# Patient Record
Sex: Male | Born: 1985 | Race: White | Hispanic: No
Health system: Southern US, Community
[De-identification: ages and names within clinical notes are randomized; demographics above are authoritative.]

## PROBLEM LIST (undated history)

## (undated) DIAGNOSIS — K219 Gastro-esophageal reflux disease without esophagitis: Secondary | ICD-10-CM

---
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
## 2012-09-28 ENCOUNTER — Encounter (HOSPITAL_COMMUNITY): Payer: Self-pay | Admitting: Family Medicine

## 2012-09-28 ENCOUNTER — Emergency Department (HOSPITAL_COMMUNITY): Payer: Self-pay

## 2012-09-28 ENCOUNTER — Emergency Department (HOSPITAL_COMMUNITY)
Admission: EM | Admit: 2012-09-28 | Discharge: 2012-09-28 | Disposition: A | Discharge status: Home / Self Care | Payer: Self-pay | Attending: Emergency Medicine | Admitting: Emergency Medicine

## 2012-09-28 DIAGNOSIS — L0390 Cellulitis, unspecified: Secondary | ICD-10-CM

## 2012-09-28 DIAGNOSIS — L02419 Cutaneous abscess of limb, unspecified: Secondary | ICD-10-CM | POA: Insufficient documentation

## 2012-09-28 DIAGNOSIS — F17200 Nicotine dependence, unspecified, uncomplicated: Secondary | ICD-10-CM | POA: Insufficient documentation

## 2012-09-28 DIAGNOSIS — L03119 Cellulitis of unspecified part of limb: Secondary | ICD-10-CM | POA: Insufficient documentation

## 2012-09-28 MED ORDER — CEPHALEXIN 250 MG PO CAPS
500.0000 mg | ORAL_CAPSULE | Freq: Once | ORAL | Status: AC
  Administered 2012-09-28: 500 mg via ORAL
  Filled 2012-09-28: qty 2

## 2012-09-28 MED ORDER — CEPHALEXIN 500 MG PO CAPS: 500.0000 mg | ORAL_CAPSULE | Freq: Four times a day (QID) | ORAL | Status: DC

## 2012-09-28 MED ORDER — OXYCODONE-ACETAMINOPHEN 5-325 MG PO TABS
2.0000 | ORAL_TABLET | Freq: Once | ORAL | Status: AC
Start: 2012-09-28 — End: 2012-09-28
  Administered 2012-09-28: 2 via ORAL
  Filled 2012-09-28: qty 2

## 2012-09-28 MED ORDER — OXYCODONE-ACETAMINOPHEN 5-325 MG PO TABS: 1.0000 | ORAL_TABLET | Freq: Four times a day (QID) | ORAL | Status: DC | PRN
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
## 2012-10-17 IMAGING — CR DG KNEE COMPLETE 4+V*L*
4 series · 4 of 4 positions shown · non-contrast
Comparison: None.

CLINICAL DATA: Pain post trauma

LEFT KNEE - COMPLETE 4+ VIEW

[t knee ap left]
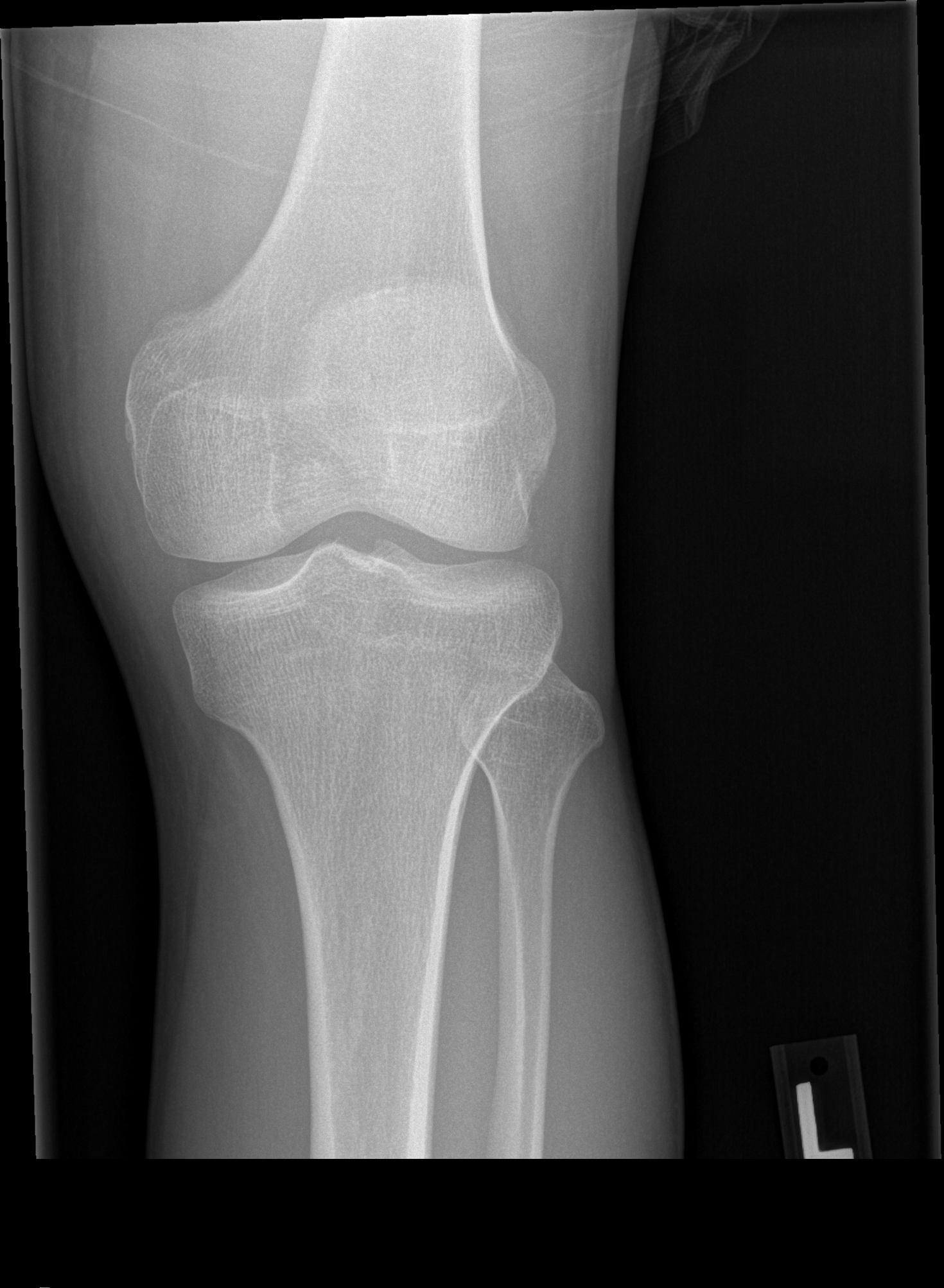

[t knee obl left (1 of 2)]
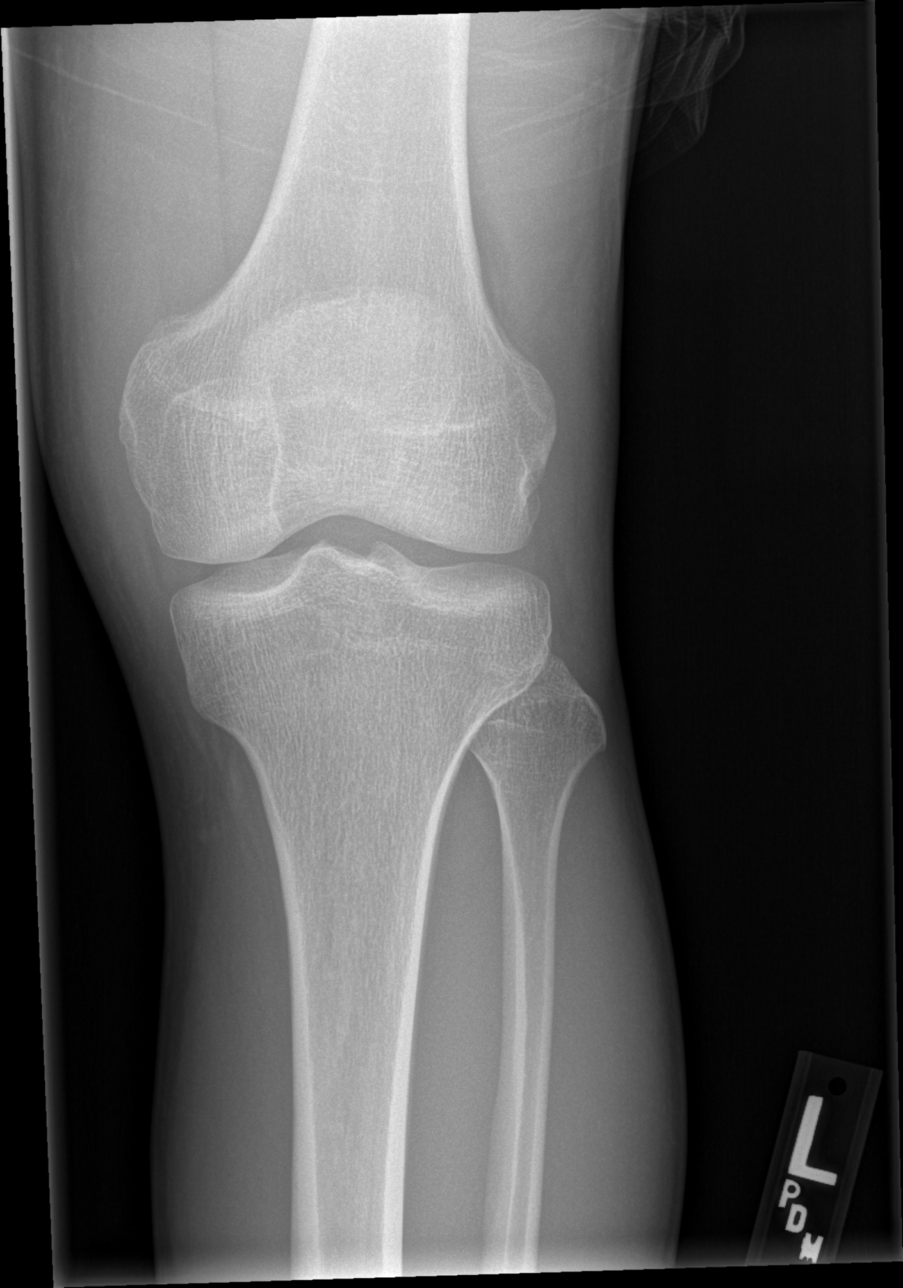

[t knee obl left (2 of 2)]
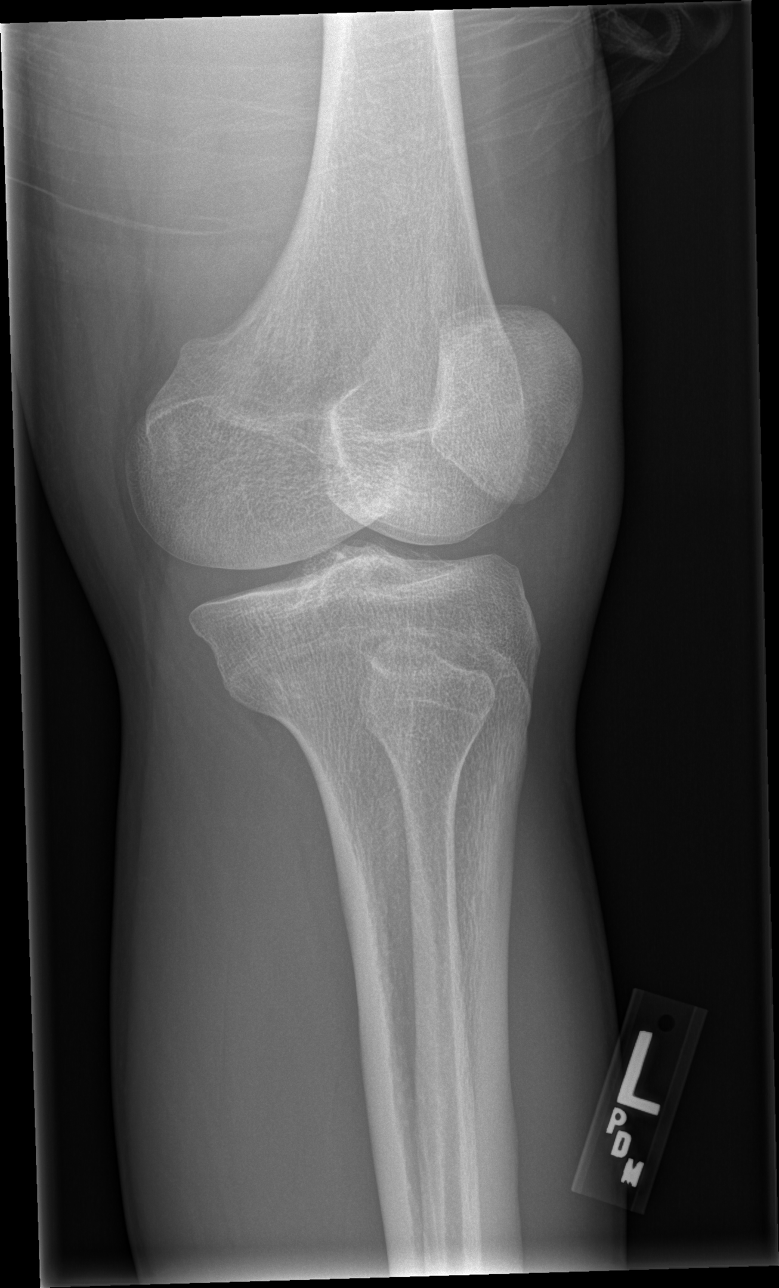

[t knee lat left]
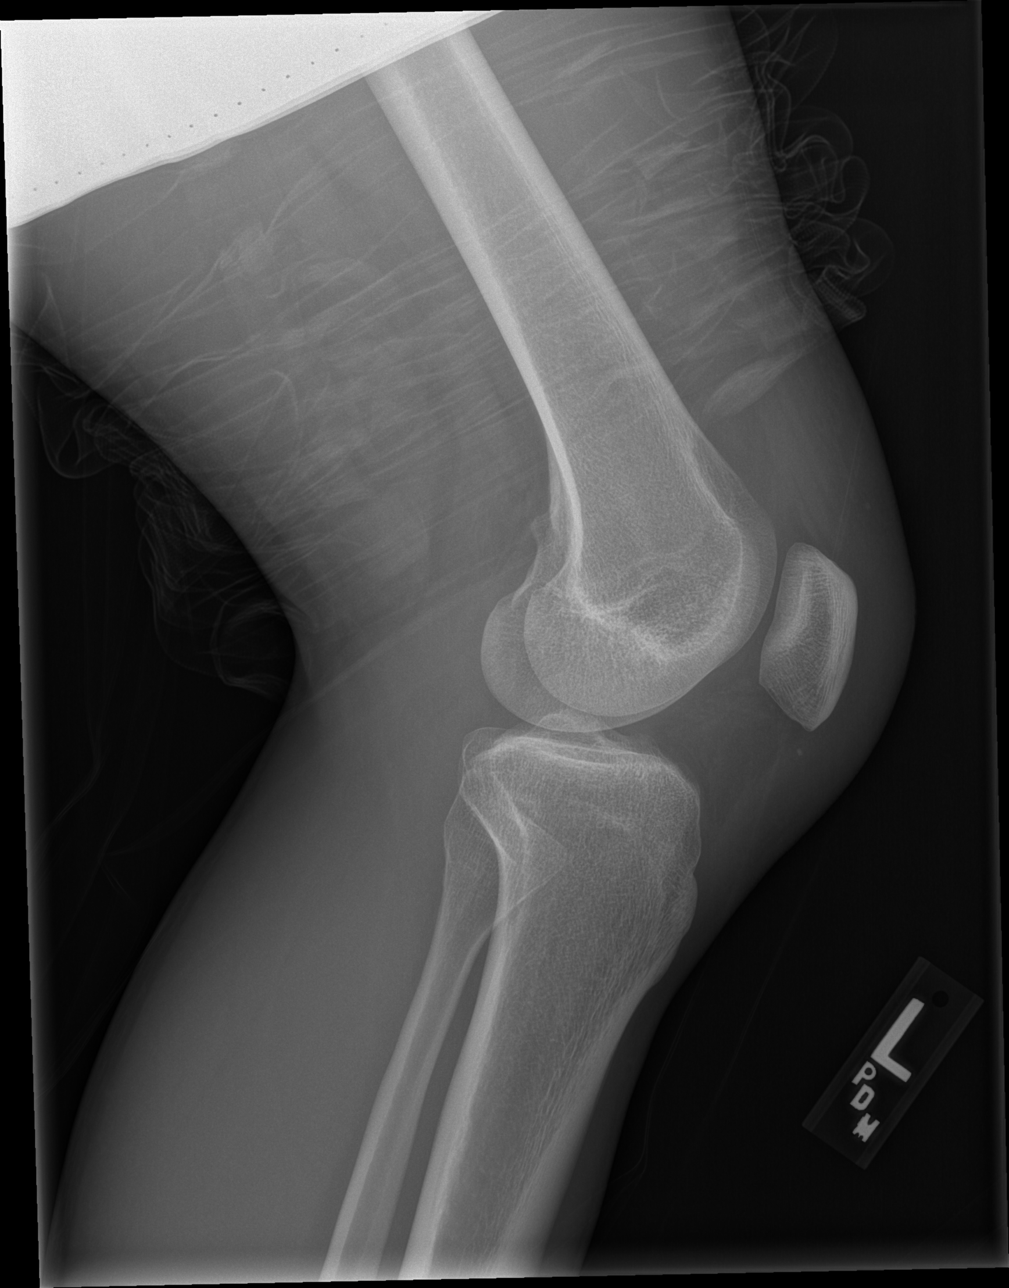

[4 of 4 positions shown; findings below may reference images not displayed]

FINDINGS: Frontal, lateral, and bilateral oblique views were
obtained.  There is no fracture, dislocation, or effusion.  Joint
spaces appear intact.
IMPRESSION: No abnormality noted.

## 2013-05-05 ENCOUNTER — Emergency Department (HOSPITAL_COMMUNITY): Payer: Self-pay

## 2013-05-05 ENCOUNTER — Emergency Department (HOSPITAL_COMMUNITY)
Admission: EM | Admit: 2013-05-05 | Discharge: 2013-05-06 | Disposition: A | Discharge status: Home / Self Care | Payer: Self-pay | Attending: Emergency Medicine | Admitting: Emergency Medicine

## 2013-05-05 ENCOUNTER — Encounter (HOSPITAL_COMMUNITY): Payer: Self-pay | Admitting: Emergency Medicine

## 2013-05-05 DIAGNOSIS — S92009A Unspecified fracture of unspecified calcaneus, initial encounter for closed fracture: Secondary | ICD-10-CM | POA: Insufficient documentation

## 2013-05-05 DIAGNOSIS — S91331A Puncture wound without foreign body, right foot, initial encounter: Secondary | ICD-10-CM

## 2013-05-05 DIAGNOSIS — Z23 Encounter for immunization: Secondary | ICD-10-CM | POA: Insufficient documentation

## 2013-05-05 DIAGNOSIS — F17200 Nicotine dependence, unspecified, uncomplicated: Secondary | ICD-10-CM | POA: Insufficient documentation

## 2013-05-05 DIAGNOSIS — S92001A Unspecified fracture of right calcaneus, initial encounter for closed fracture: Secondary | ICD-10-CM

## 2013-05-05 MED ORDER — FENTANYL CITRATE 0.05 MG/ML IJ SOLN
50.0000 ug | Freq: Once | INTRAMUSCULAR | Status: AC
  Administered 2013-05-05: 50 ug via INTRAVENOUS
  Filled 2013-05-05: qty 2

## 2013-05-05 MED ORDER — TETANUS-DIPHTH-ACELL PERTUSSIS 5-2.5-18.5 LF-MCG/0.5 IM SUSP
0.5000 mL | Freq: Once | INTRAMUSCULAR | Status: AC
  Administered 2013-05-06: 0.5 mL via INTRAMUSCULAR
  Filled 2013-05-05: qty 0.5
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
## 2013-05-05 NOTE — ED Notes (Signed)
[place] Archivist at bedside.
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
## 2013-05-05 NOTE — ED Notes (Signed)
Pt transported to XR.  

## 2013-05-06 ENCOUNTER — Encounter (HOSPITAL_COMMUNITY): Payer: Self-pay | Admitting: Radiology

## 2013-05-06 ENCOUNTER — Emergency Department (HOSPITAL_COMMUNITY): Payer: Self-pay

## 2013-05-06 MED ORDER — FENTANYL CITRATE 0.05 MG/ML IJ SOLN
100.0000 ug | Freq: Once | INTRAMUSCULAR | Status: AC
  Administered 2013-05-06: 100 ug via INTRAVENOUS
  Filled 2013-05-06: qty 2

## 2013-05-06 MED ORDER — FENTANYL CITRATE 0.05 MG/ML IJ SOLN
50.0000 ug | Freq: Once | INTRAMUSCULAR | Status: AC
  Administered 2013-05-06: 50 ug via INTRAVENOUS

## 2013-05-06 MED ORDER — OXYCODONE-ACETAMINOPHEN 5-325 MG PO TABS: 1.0000 | ORAL_TABLET | ORAL | Status: DC | PRN

## 2013-05-06 MED ORDER — CEFAZOLIN SODIUM-DEXTROSE 2-3 GM-% IV SOLR
2.0000 g | Freq: Once | INTRAVENOUS | Status: AC
  Administered 2013-05-06: 2 g via INTRAVENOUS
  Filled 2013-05-06: qty 50

## 2013-05-06 MED ORDER — CEPHALEXIN 500 MG PO CAPS: 500.0000 mg | ORAL_CAPSULE | Freq: Four times a day (QID) | ORAL | Status: DC

## 2013-05-06 MED ORDER — CEFAZOLIN SODIUM 1-5 GM-% IV SOLN: 1.0000 g | Freq: Once | INTRAVENOUS | Status: DC

## 2013-05-06 NOTE — ED Notes (Signed)
Pt has ride home.

## 2013-05-06 NOTE — ED Notes (Signed)
Pt returned from CT °

## 2013-05-06 NOTE — Progress Notes (Signed)
Orthopedic Tech Progress Note Patient Details:  Ricky Colon October 26, 1986 [id number]  Ortho Devices Type of Ortho Device: Crutches;Post (short leg) splint   Haskell Flirt 05/06/2013, 1:23 AM

## 2013-05-06 NOTE — ED Notes (Signed)
Ortho paged. 
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
## 2013-05-07 ENCOUNTER — Encounter (HOSPITAL_COMMUNITY): Payer: Self-pay | Admitting: *Deleted

## 2013-05-07 ENCOUNTER — Emergency Department (HOSPITAL_COMMUNITY)
Admission: EM | Admit: 2013-05-07 | Discharge: 2013-05-07 | Disposition: A | Discharge status: Home / Self Care | Payer: Self-pay | Attending: Emergency Medicine | Admitting: Emergency Medicine

## 2013-05-07 DIAGNOSIS — F17200 Nicotine dependence, unspecified, uncomplicated: Secondary | ICD-10-CM | POA: Insufficient documentation

## 2013-05-07 DIAGNOSIS — Z760 Encounter for issue of repeat prescription: Secondary | ICD-10-CM | POA: Insufficient documentation

## 2013-05-07 DIAGNOSIS — Z87828 Personal history of other (healed) physical injury and trauma: Secondary | ICD-10-CM | POA: Insufficient documentation

## 2013-05-07 DIAGNOSIS — R110 Nausea: Secondary | ICD-10-CM | POA: Insufficient documentation

## 2013-05-07 MED ORDER — OXYCODONE-ACETAMINOPHEN 7.5-325 MG PO TABS: 1.0000 | ORAL_TABLET | Freq: Four times a day (QID) | ORAL | Status: DC | PRN

## 2013-05-07 MED ORDER — PROMETHAZINE HCL 25 MG PO TABS: 25.0000 mg | ORAL_TABLET | Freq: Four times a day (QID) | ORAL | Status: DC | PRN
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
## 2013-05-09 ENCOUNTER — Telehealth (HOSPITAL_COMMUNITY): Payer: Self-pay | Admitting: Emergency Medicine

## 2013-05-09 ENCOUNTER — Emergency Department (HOSPITAL_COMMUNITY)
Admission: EM | Admit: 2013-05-09 | Discharge: 2013-05-09 | Disposition: A | Discharge status: Home / Self Care | Payer: Self-pay | Attending: Emergency Medicine | Admitting: Emergency Medicine

## 2013-05-09 ENCOUNTER — Encounter (HOSPITAL_COMMUNITY): Payer: Self-pay | Admitting: *Deleted

## 2013-05-09 DIAGNOSIS — M79609 Pain in unspecified limb: Secondary | ICD-10-CM | POA: Insufficient documentation

## 2013-05-09 DIAGNOSIS — F17200 Nicotine dependence, unspecified, uncomplicated: Secondary | ICD-10-CM | POA: Insufficient documentation

## 2013-05-09 DIAGNOSIS — Z760 Encounter for issue of repeat prescription: Secondary | ICD-10-CM | POA: Insufficient documentation

## 2013-05-09 MED ORDER — OXYCODONE-ACETAMINOPHEN 7.5-325 MG PO TABS: 1.0000 | ORAL_TABLET | ORAL | Status: DC | PRN
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
## 2013-05-09 NOTE — ED Provider Notes (Signed)
Medical screening examination/treatment/procedure(s) were conducted as a shared visit with non-physician practitioner(s) and myself.  I personally evaluated the patient during the encounter.  Neurovascular intact of right foot.   Patient has orthopedic followup  Donnetta Hutching, MD 05/09/13 1514

## 2013-05-10 ENCOUNTER — Encounter (HOSPITAL_COMMUNITY): Payer: Self-pay | Admitting: Pharmacy Technician

## 2013-05-10 ENCOUNTER — Encounter (HOSPITAL_COMMUNITY): Payer: Self-pay | Admitting: *Deleted

## 2013-05-10 ENCOUNTER — Other Ambulatory Visit (HOSPITAL_COMMUNITY): Payer: Self-pay | Admitting: Orthopedic Surgery

## 2013-05-11 ENCOUNTER — Encounter (HOSPITAL_COMMUNITY): Payer: Self-pay | Admitting: Anesthesiology

## 2013-05-11 ENCOUNTER — Encounter (HOSPITAL_COMMUNITY): Payer: Self-pay | Admitting: Surgery

## 2013-05-11 ENCOUNTER — Observation Stay (HOSPITAL_COMMUNITY)
Admission: RE | Admit: 2013-05-11 | Discharge: 2013-05-12 | Disposition: A | Discharge status: Home / Self Care | Payer: Self-pay | Source: Ambulatory Visit | Attending: Orthopedic Surgery | Admitting: Orthopedic Surgery

## 2013-05-11 ENCOUNTER — Encounter (HOSPITAL_COMMUNITY)
Admission: RE | Disposition: A | Discharge status: Home / Self Care | Payer: Self-pay | Source: Ambulatory Visit | Attending: Orthopedic Surgery

## 2013-05-11 ENCOUNTER — Ambulatory Visit (HOSPITAL_COMMUNITY): Payer: Self-pay | Admitting: Anesthesiology

## 2013-05-11 DIAGNOSIS — S92001S Unspecified fracture of right calcaneus, sequela: Secondary | ICD-10-CM

## 2013-05-11 DIAGNOSIS — F17200 Nicotine dependence, unspecified, uncomplicated: Secondary | ICD-10-CM | POA: Insufficient documentation

## 2013-05-11 DIAGNOSIS — S92009B Unspecified fracture of unspecified calcaneus, initial encounter for open fracture: Principal | ICD-10-CM | POA: Insufficient documentation

## 2013-05-11 DIAGNOSIS — R454 Irritability and anger: Secondary | ICD-10-CM | POA: Insufficient documentation

## 2013-05-11 HISTORY — DX: Gastro-esophageal reflux disease without esophagitis: K21.9

## 2013-05-11 HISTORY — PX: ORIF CALCANEOUS FRACTURE: SHX5030

## 2013-05-11 LAB — COMPREHENSIVE METABOLIC PANEL
ALT: 21 U/L (ref 0–53)
AST: 27 U/L (ref 0–37)
CO2: 29 mEq/L (ref 19–32)
Calcium: 9.3 mg/dL (ref 8.4–10.5)
Creatinine, Ser: 1.02 mg/dL (ref 0.50–1.35)
GFR calc Af Amer: >90 mL/min (ref >90)
GFR calc non Af Amer: >90 mL/min (ref >90)
Glucose, Bld: 84 mg/dL (ref 70–99)
Sodium: 137 mEq/L (ref 135–145)
Total Protein: 6.4 g/dL (ref 6.0–8.3)

## 2013-05-11 LAB — CBC
MCH: 32.3 pg (ref 26.0–34.0)
MCHC: 35.3 g/dL (ref 30.0–36.0)
MCV: 91.6 fL (ref 78.0–100.0)
Platelets: 211 10*3/uL (ref 150–400)

## 2013-05-11 LAB — SURGICAL PCR SCREEN: MRSA, PCR: NEGATIVE

## 2013-05-11 SURGERY — OPEN REDUCTION INTERNAL FIXATION (ORIF) CALCANEOUS FRACTURE
Anesthesia: General | Site: Foot | Laterality: Right | Wound class: Dirty or Infected

## 2013-05-11 MED ORDER — CEFAZOLIN SODIUM 1-5 GM-% IV SOLN
1.0000 g | Freq: Four times a day (QID) | INTRAVENOUS | Status: DC
  Administered 2013-05-11 – 2013-05-12 (×2): 1 g via INTRAVENOUS
  Filled 2013-05-11 (×3): qty 50

## 2013-05-11 MED ORDER — ROCURONIUM BROMIDE 100 MG/10ML IV SOLN
INTRAVENOUS | Status: DC | PRN
  Administered 2013-05-11: 40 mg via INTRAVENOUS

## 2013-05-11 MED ORDER — OXYCODONE HCL 5 MG PO TABS: 5.0000 mg | ORAL_TABLET | Freq: Once | ORAL | Status: DC | PRN

## 2013-05-11 MED ORDER — CEFAZOLIN SODIUM-DEXTROSE 2-3 GM-% IV SOLR
INTRAVENOUS | Status: AC
  Filled 2013-05-11: qty 50

## 2013-05-11 MED ORDER — MUPIROCIN 2 % EX OINT
TOPICAL_OINTMENT | CUTANEOUS | Status: AC
  Filled 2013-05-11: qty 22

## 2013-05-11 MED ORDER — GENTAMICIN SULFATE 40 MG/ML IJ SOLN
INTRAMUSCULAR | Status: AC
  Filled 2013-05-11: qty 4

## 2013-05-11 MED ORDER — ONDANSETRON HCL 4 MG/2ML IJ SOLN
4.0000 mg | Freq: Four times a day (QID) | INTRAMUSCULAR | Status: DC | PRN
  Administered 2013-05-11: 4 mg via INTRAVENOUS
  Filled 2013-05-11: qty 2

## 2013-05-11 MED ORDER — METOCLOPRAMIDE HCL 5 MG/ML IJ SOLN
5.0000 mg | Freq: Three times a day (TID) | INTRAMUSCULAR | Status: DC | PRN
  Filled 2013-05-11: qty 2

## 2013-05-11 MED ORDER — HYDROCODONE-ACETAMINOPHEN 5-325 MG PO TABS
1.0000 | ORAL_TABLET | ORAL | Status: DC | PRN
  Administered 2013-05-12: 2 via ORAL
  Filled 2013-05-11: qty 2

## 2013-05-11 MED ORDER — HYDROMORPHONE HCL PF 1 MG/ML IJ SOLN
INTRAMUSCULAR | Status: AC
  Filled 2013-05-11: qty 1

## 2013-05-11 MED ORDER — GENTAMICIN SULFATE 40 MG/ML IJ SOLN
INTRAMUSCULAR | Status: DC | PRN
  Administered 2013-05-11: 160 mg

## 2013-05-11 MED ORDER — GLYCOPYRROLATE 0.2 MG/ML IJ SOLN
INTRAMUSCULAR | Status: DC | PRN
  Administered 2013-05-11: 0.6 mg via INTRAVENOUS

## 2013-05-11 MED ORDER — MIDAZOLAM HCL 5 MG/5ML IJ SOLN
INTRAMUSCULAR | Status: DC | PRN
  Administered 2013-05-11: 2 mg via INTRAVENOUS

## 2013-05-11 MED ORDER — FENTANYL CITRATE 0.05 MG/ML IJ SOLN
INTRAMUSCULAR | Status: DC | PRN
  Administered 2013-05-11 (×2): 50 ug via INTRAVENOUS
  Administered 2013-05-11: 150 ug via INTRAVENOUS

## 2013-05-11 MED ORDER — 0.9 % SODIUM CHLORIDE (POUR BTL) OPTIME
TOPICAL | Status: DC | PRN
  Administered 2013-05-11: 1000 mL

## 2013-05-11 MED ORDER — LACTATED RINGERS IV SOLN
INTRAVENOUS | Status: DC | PRN
  Administered 2013-05-11 (×2): via INTRAVENOUS

## 2013-05-11 MED ORDER — HYDROMORPHONE HCL PF 1 MG/ML IJ SOLN
0.2500 mg | INTRAMUSCULAR | Status: DC | PRN
  Administered 2013-05-11 (×4): 0.5 mg via INTRAVENOUS

## 2013-05-11 MED ORDER — ONDANSETRON HCL 4 MG/2ML IJ SOLN: 4.0000 mg | Freq: Four times a day (QID) | INTRAMUSCULAR | Status: DC | PRN

## 2013-05-11 MED ORDER — VANCOMYCIN HCL 500 MG IV SOLR
INTRAVENOUS | Status: DC | PRN
  Administered 2013-05-11: 500 mg

## 2013-05-11 MED ORDER — LACTATED RINGERS IV SOLN
INTRAVENOUS | Status: DC
  Administered 2013-05-11: 18:00:00 via INTRAVENOUS

## 2013-05-11 MED ORDER — SODIUM CHLORIDE 0.9 % IV SOLN: INTRAVENOUS | Status: DC

## 2013-05-11 MED ORDER — HYDROMORPHONE HCL PF 1 MG/ML IJ SOLN: 0.2500 mg | INTRAMUSCULAR | Status: DC | PRN

## 2013-05-11 MED ORDER — HYDROMORPHONE HCL PF 1 MG/ML IJ SOLN
0.5000 mg | INTRAMUSCULAR | Status: DC | PRN
  Administered 2013-05-11 – 2013-05-12 (×2): 1 mg via INTRAVENOUS
  Filled 2013-05-11 (×2): qty 1

## 2013-05-11 MED ORDER — OXYCODONE HCL 5 MG/5ML PO SOLN: 5.0000 mg | Freq: Once | ORAL | Status: DC | PRN

## 2013-05-11 MED ORDER — ONDANSETRON HCL 4 MG PO TABS: 4.0000 mg | ORAL_TABLET | Freq: Four times a day (QID) | ORAL | Status: DC | PRN

## 2013-05-11 MED ORDER — LIDOCAINE HCL (CARDIAC) 20 MG/ML IV SOLN
INTRAVENOUS | Status: DC | PRN
  Administered 2013-05-11: 80 mg via INTRAVENOUS

## 2013-05-11 MED ORDER — ASPIRIN EC 325 MG PO TBEC
325.0000 mg | DELAYED_RELEASE_TABLET | Freq: Every day | ORAL | Status: DC
  Filled 2013-05-11: qty 1

## 2013-05-11 MED ORDER — HYDROMORPHONE HCL PF 1 MG/ML IJ SOLN
0.2500 mg | INTRAMUSCULAR | Status: DC | PRN
  Administered 2013-05-11 (×3): 0.5 mg via INTRAVENOUS

## 2013-05-11 MED ORDER — OXYCODONE-ACETAMINOPHEN 5-325 MG PO TABS
1.0000 | ORAL_TABLET | ORAL | Status: DC | PRN
  Administered 2013-05-11 – 2013-05-12 (×3): 2 via ORAL
  Filled 2013-05-11 (×4): qty 2

## 2013-05-11 MED ORDER — VANCOMYCIN HCL 500 MG IV SOLR
INTRAVENOUS | Status: AC
  Filled 2013-05-11: qty 500

## 2013-05-11 MED ORDER — METOCLOPRAMIDE HCL 5 MG PO TABS
5.0000 mg | ORAL_TABLET | Freq: Three times a day (TID) | ORAL | Status: DC | PRN
  Filled 2013-05-11: qty 2

## 2013-05-11 MED ORDER — NEOSTIGMINE METHYLSULFATE 1 MG/ML IJ SOLN
INTRAMUSCULAR | Status: DC | PRN
  Administered 2013-05-11: 4 mg via INTRAVENOUS

## 2013-05-11 MED ORDER — ONDANSETRON HCL 4 MG/2ML IJ SOLN
INTRAMUSCULAR | Status: DC | PRN
  Administered 2013-05-11: 4 mg via INTRAVENOUS

## 2013-05-11 MED ORDER — MUPIROCIN 2 % EX OINT
TOPICAL_OINTMENT | Freq: Two times a day (BID) | CUTANEOUS | Status: DC
  Administered 2013-05-11: 15:00:00 via NASAL

## 2013-05-11 MED ORDER — PROPOFOL 10 MG/ML IV BOLUS
INTRAVENOUS | Status: DC | PRN
  Administered 2013-05-11: 200 mg via INTRAVENOUS

## 2013-05-11 SURGICAL SUPPLY — 50 items
BANDAGE ELASTIC 4 VELCRO ST LF (GAUZE/BANDAGES/DRESSINGS) IMPLANT
BANDAGE ELASTIC 6 VELCRO ST LF (GAUZE/BANDAGES/DRESSINGS) IMPLANT
BANDAGE ESMARK 6X9 LF (GAUZE/BANDAGES/DRESSINGS) IMPLANT
BANDAGE GAUZE ELAST BULKY 4 IN (GAUZE/BANDAGES/DRESSINGS) ×1 IMPLANT
BNDG CMPR 9X6 STRL LF SNTH (GAUZE/BANDAGES/DRESSINGS)
BNDG COHESIVE 6X5 TAN STRL LF (GAUZE/BANDAGES/DRESSINGS) ×3 IMPLANT
BNDG ESMARK 6X9 LF (GAUZE/BANDAGES/DRESSINGS)
BNDG GAUZE STRTCH 6 (GAUZE/BANDAGES/DRESSINGS) ×6 IMPLANT
CLOTH BEACON ORANGE TIMEOUT ST (SAFETY) ×2 IMPLANT
COTTON STERILE ROLL (GAUZE/BANDAGES/DRESSINGS) ×2 IMPLANT
COVER SURGICAL LIGHT HANDLE (MISCELLANEOUS) ×2 IMPLANT
CUFF TOURNIQUET SINGLE 34IN LL (TOURNIQUET CUFF) IMPLANT
CUFF TOURNIQUET SINGLE 44IN (TOURNIQUET CUFF) IMPLANT
DRAPE INCISE IOBAN 66X45 STRL (DRAPES) ×2 IMPLANT
DRAPE OEC MINIVIEW 54X84 (DRAPES) ×2 IMPLANT
DRAPE PROXIMA HALF (DRAPES) ×2 IMPLANT
DRAPE U-SHAPE 47X51 STRL (DRAPES) ×2 IMPLANT
DRSG ADAPTIC 3X8 NADH LF (GAUZE/BANDAGES/DRESSINGS) ×2 IMPLANT
DRSG EMULSION OIL 3X3 NADH (GAUZE/BANDAGES/DRESSINGS) ×1 IMPLANT
DRSG PAD ABDOMINAL 8X10 ST (GAUZE/BANDAGES/DRESSINGS) ×1 IMPLANT
DURAPREP 26ML APPLICATOR (WOUND CARE) ×2 IMPLANT
ELECT REM PT RETURN 9FT ADLT (ELECTROSURGICAL) ×2
ELECTRODE REM PT RTRN 9FT ADLT (ELECTROSURGICAL) ×1 IMPLANT
GLOVE BIOGEL PI IND STRL 9 (GLOVE) ×1 IMPLANT
GLOVE BIOGEL PI INDICATOR 9 (GLOVE) ×1
GLOVE SURG ORTHO 9.0 STRL STRW (GLOVE) ×2 IMPLANT
GOWN PREVENTION PLUS XLARGE (GOWN DISPOSABLE) ×2 IMPLANT
GOWN SRG XL XLNG 56XLVL 4 (GOWN DISPOSABLE) ×2 IMPLANT
GOWN STRL NON-REIN XL XLG LVL4 (GOWN DISPOSABLE) ×4
GUIDEPIN 2.8X300 THRD TIP OIC (Screw) ×2 IMPLANT
KIT BASIN OR (CUSTOM PROCEDURE TRAY) ×2 IMPLANT
KIT ROOM TURNOVER OR (KITS) ×2 IMPLANT
KIT STIMULAN RAPID CURE 5CC (Orthopedic Implant) ×1 IMPLANT
MANIFOLD NEPTUNE II (INSTRUMENTS) ×2 IMPLANT
NS IRRIG 1000ML POUR BTL (IV SOLUTION) ×2 IMPLANT
PACK ORTHO EXTREMITY (CUSTOM PROCEDURE TRAY) ×2 IMPLANT
PAD ARMBOARD 7.5X6 YLW CONV (MISCELLANEOUS) ×4 IMPLANT
PADDING CAST COTTON 6X4 STRL (CAST SUPPLIES) ×2 IMPLANT
SCREW CANN 7.3X45 16MM THRD (Screw) ×1 IMPLANT
SCREW CANN 7.3X50 16MM THRD (Screw) ×1 IMPLANT
SPONGE GAUZE 4X4 12PLY (GAUZE/BANDAGES/DRESSINGS) ×2 IMPLANT
SPONGE LAP 18X18 X RAY DECT (DISPOSABLE) ×2 IMPLANT
STAPLER VISISTAT 35W (STAPLE) IMPLANT
SUCTION FRAZIER TIP 10 FR DISP (SUCTIONS) ×2 IMPLANT
SUT ETHILON 2 0 PSLX (SUTURE) IMPLANT
SUT VIC AB 2-0 CTB1 (SUTURE) ×4 IMPLANT
TOWEL OR 17X24 6PK STRL BLUE (TOWEL DISPOSABLE) ×2 IMPLANT
TOWEL OR 17X26 10 PK STRL BLUE (TOWEL DISPOSABLE) ×2 IMPLANT
TUBE CONNECTING 12X1/4 (SUCTIONS) ×2 IMPLANT
WATER STERILE IRR 1000ML POUR (IV SOLUTION) ×2 IMPLANT

## 2013-05-11 NOTE — ED Provider Notes (Signed)
Medical screening examination/treatment/procedure(s) were performed by non-physician practitioner and as supervising physician I was immediately available for consultation/collaboration.  Geoffery Lyons, MD 05/11/13 [phone]

## 2013-05-11 NOTE — Anesthesia Postprocedure Evaluation (Signed)
Anesthesia Post Note  Patient: Ricky Colon  Procedure(s) Performed: Procedure(s) (LRB): OPEN REDUCTION INTERNAL FIXATION (ORIF) CALCANEOUS FRACTURE (Right)  Anesthesia type: General  Patient location: PACU  Post pain: Pain level controlled and Adequate analgesia  Post assessment: Post-op Vital signs reviewed, Patient's Cardiovascular Status Stable, Respiratory Function Stable, Patent Airway and Pain level controlled  Last Vitals:  Filed Vitals:   05/11/13 2105  BP: 121/59  Pulse: 60  Temp: 36.7 C  Resp: 15    Post vital signs: Reviewed and stable  Level of consciousness: awake, alert  and oriented  Complications: No apparent anesthesia complications
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
## 2013-05-11 NOTE — Anesthesia Preprocedure Evaluation (Addendum)
Anesthesia Evaluation  Patient identified by MRN, date of birth, ID band Patient awake    Reviewed: Allergy & Precautions, H&P , NPO status , Patient's Chart, lab work & pertinent test results  History of Anesthesia Complications Negative for: history of anesthetic complications  Airway Mallampati: I TM Distance: >3 FB Neck ROM: Full    Dental  (+) Teeth Intact and Dental Advisory Given   Pulmonary Current Smoker,  breath sounds clear to auscultation  Pulmonary exam normal       Cardiovascular Exercise Tolerance: Good negative cardio ROS  Rhythm:Regular Rate:Normal     Neuro/Psych negative neurological ROS     GI/Hepatic negative GI ROS, Neg liver ROS,   Endo/Other  negative endocrine ROS  Renal/GU negative Renal ROS     Musculoskeletal negative musculoskeletal ROS (+)   Abdominal   Peds  Hematology negative hematology ROS (+)   Anesthesia Other Findings   Reproductive/Obstetrics                           Anesthesia Physical Anesthesia Plan  ASA: I  Anesthesia Plan: General   Post-op Pain Management:    Induction: Intravenous  Airway Management Planned: Oral ETT  Additional Equipment:   Intra-op Plan:   Post-operative Plan: Extubation in OR  Informed Consent: I have reviewed the patients History and Physical, chart, labs and discussed the procedure including the risks, benefits and alternatives for the proposed anesthesia with the patient or authorized representative who has indicated his/her understanding and acceptance.   Dental advisory given  Plan Discussed with: CRNA and Anesthesiologist  Anesthesia Plan Comments:         Anesthesia Quick Evaluation
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
## 2013-05-11 NOTE — Transfer of Care (Signed)
Immediate Anesthesia Transfer of Care Note  Patient: Ricky Colon  Procedure(s) Performed: Procedure(s) with comments: OPEN REDUCTION INTERNAL FIXATION (ORIF) CALCANEOUS FRACTURE (Right) - Open Reduction Internal Fixation Right Calcaneous Fracture  Patient Location: PACU  Anesthesia Type:General  Level of Consciousness: awake, alert , oriented and patient cooperative  Airway & Oxygen Therapy: Patient Spontanous Breathing and Patient connected to nasal cannula oxygen  Post-op Assessment: Report given to PACU RN, Post -op Vital signs reviewed and stable and Patient moving all extremities  Post vital signs: Reviewed and stable  Complications: No apparent anesthesia complications
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
## 2013-05-11 NOTE — Preoperative (Signed)
Beta Blockers   Reason not to administer Beta Blockers:Not Applicable 

## 2013-05-11 NOTE — Anesthesia Procedure Notes (Signed)
Procedure Name: Intubation Date/Time: 05/11/2013 6:48 PM Performed by: Angelica Pou Pre-anesthesia Checklist: Patient identified, Emergency Drugs available, Suction available, Patient being monitored and Timeout performed Patient Re-evaluated:Patient Re-evaluated prior to inductionOxygen Delivery Method: Circle system utilized Preoxygenation: Pre-oxygenation with 100% oxygen Intubation Type: IV induction Ventilation: Mask ventilation without difficulty Laryngoscope Size: Mac and 3 Grade View: Grade I Tube type: Oral Tube size: 7.5 mm Number of attempts: 1 Airway Equipment and Method: Stylet Placement Confirmation: ETT inserted through vocal cords under direct vision,  breath sounds checked- equal and bilateral and positive ETCO2 Secured at: 23 cm Tube secured with: Tape Dental Injury: Teeth and Oropharynx as per pre-operative assessment

## 2013-05-12 ENCOUNTER — Encounter (HOSPITAL_COMMUNITY): Payer: Self-pay | Admitting: Orthopedic Surgery

## 2013-05-12 MED ORDER — METHOCARBAMOL 500 MG PO TABS
500.0000 mg | ORAL_TABLET | Freq: Once | ORAL | Status: AC
  Administered 2013-05-12: 500 mg via ORAL
  Filled 2013-05-12: qty 1

## 2013-05-12 NOTE — Discharge Summary (Signed)
Discharge to home °
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
## 2013-05-12 NOTE — Progress Notes (Signed)
Patient discharged to home. Patient AVS reviewed with patient and patient's father. Patient verbalized understanding of medications and follow-up appointments.  Patient remains stable; no signs or symptoms of distress.  Patient educated to return to the ER in cases of signs of infection, SOB, dizziness, fever, chest pain, or fainting.

## 2013-05-12 NOTE — Progress Notes (Signed)
Called central monitoring to d/c tele. Pt has order for discharge, tele was on standby due to patient refusing to wear tele this am.

## 2015-09-20 ENCOUNTER — Ambulatory Visit: Payer: Self-pay | Admitting: Family Medicine

## 2020-06-02 ENCOUNTER — Inpatient Hospital Stay (HOSPITAL_COMMUNITY)
Admission: EM | Admit: 2020-06-02 | Discharge: 2020-06-05 | DRG: 040 | Disposition: A | Discharge status: Home / Self Care | Payer: Self-pay | Attending: Surgery | Admitting: Surgery

## 2020-06-02 ENCOUNTER — Emergency Department (HOSPITAL_COMMUNITY): Payer: Self-pay

## 2020-06-02 ENCOUNTER — Encounter (HOSPITAL_COMMUNITY): Payer: Self-pay | Admitting: Emergency Medicine

## 2020-06-02 DIAGNOSIS — S0240CA Maxillary fracture, right side, initial encounter for closed fracture: Secondary | ICD-10-CM | POA: Diagnosis present

## 2020-06-02 DIAGNOSIS — S01511A Laceration without foreign body of lip, initial encounter: Secondary | ICD-10-CM | POA: Diagnosis present

## 2020-06-02 DIAGNOSIS — Z20822 Contact with and (suspected) exposure to covid-19: Secondary | ICD-10-CM | POA: Diagnosis present

## 2020-06-02 DIAGNOSIS — T07XXXA Unspecified multiple injuries, initial encounter: Secondary | ICD-10-CM

## 2020-06-02 DIAGNOSIS — R402242 Coma scale, best verbal response, confused conversation, at arrival to emergency department: Secondary | ICD-10-CM | POA: Diagnosis present

## 2020-06-02 DIAGNOSIS — S0232XA Fracture of orbital floor, left side, initial encounter for closed fracture: Secondary | ICD-10-CM | POA: Diagnosis present

## 2020-06-02 DIAGNOSIS — F10229 Alcohol dependence with intoxication, unspecified: Secondary | ICD-10-CM | POA: Diagnosis present

## 2020-06-02 DIAGNOSIS — Y908 Blood alcohol level of 240 mg/100 ml or more: Secondary | ICD-10-CM | POA: Diagnosis present

## 2020-06-02 DIAGNOSIS — N179 Acute kidney failure, unspecified: Secondary | ICD-10-CM | POA: Diagnosis present

## 2020-06-02 DIAGNOSIS — R402142 Coma scale, eyes open, spontaneous, at arrival to emergency department: Secondary | ICD-10-CM | POA: Diagnosis present

## 2020-06-02 DIAGNOSIS — S0292XA Unspecified fracture of facial bones, initial encounter for closed fracture: Secondary | ICD-10-CM | POA: Diagnosis present

## 2020-06-02 DIAGNOSIS — S022XXB Fracture of nasal bones, initial encounter for open fracture: Secondary | ICD-10-CM | POA: Diagnosis present

## 2020-06-02 DIAGNOSIS — R739 Hyperglycemia, unspecified: Secondary | ICD-10-CM | POA: Diagnosis present

## 2020-06-02 DIAGNOSIS — S060X0A Concussion without loss of consciousness, initial encounter: Secondary | ICD-10-CM

## 2020-06-02 DIAGNOSIS — R000 Tachycardia, unspecified: Secondary | ICD-10-CM | POA: Diagnosis present

## 2020-06-02 DIAGNOSIS — S41112A Laceration without foreign body of left upper arm, initial encounter: Secondary | ICD-10-CM | POA: Diagnosis present

## 2020-06-02 DIAGNOSIS — R402362 Coma scale, best motor response, obeys commands, at arrival to emergency department: Secondary | ICD-10-CM | POA: Diagnosis present

## 2020-06-02 DIAGNOSIS — J9601 Acute respiratory failure with hypoxia: Secondary | ICD-10-CM | POA: Diagnosis present

## 2020-06-02 DIAGNOSIS — S060X9A Concussion with loss of consciousness of unspecified duration, initial encounter: Principal | ICD-10-CM | POA: Diagnosis present

## 2020-06-02 DIAGNOSIS — F10239 Alcohol dependence with withdrawal, unspecified: Secondary | ICD-10-CM | POA: Diagnosis not present

## 2020-06-02 DIAGNOSIS — S0181XA Laceration without foreign body of other part of head, initial encounter: Secondary | ICD-10-CM | POA: Diagnosis present

## 2020-06-02 DIAGNOSIS — S0240DA Maxillary fracture, left side, initial encounter for closed fracture: Secondary | ICD-10-CM

## 2020-06-02 DIAGNOSIS — T1490XA Injury, unspecified, initial encounter: Secondary | ICD-10-CM

## 2020-06-02 DIAGNOSIS — S01312A Laceration without foreign body of left ear, initial encounter: Secondary | ICD-10-CM

## 2020-06-02 DIAGNOSIS — J9811 Atelectasis: Secondary | ICD-10-CM | POA: Diagnosis present

## 2020-06-02 LAB — ETHANOL: Alcohol, Ethyl (B): 266 mg/dL — ABNORMAL HIGH (ref <10)

## 2020-06-02 LAB — PROTIME-INR
INR: 1 (ref 0.8–1.2)
Prothrombin Time: 12.8 seconds (ref 11.4–15.2)

## 2020-06-02 LAB — I-STAT CHEM 8, ED
BUN: 10 mg/dL (ref 6–20)
Calcium, Ion: 1 mmol/L — ABNORMAL LOW (ref 1.15–1.40)
Chloride: 107 mmol/L (ref 98–111)
Creatinine, Ser: 1.5 mg/dL — ABNORMAL HIGH (ref 0.61–1.24)
Glucose, Bld: 108 mg/dL — ABNORMAL HIGH (ref 70–99)
HCT: 44 % (ref 39.0–52.0)
Hemoglobin: 15 g/dL (ref 13.0–17.0)
Potassium: 2.9 mmol/L — ABNORMAL LOW (ref 3.5–5.1)
Sodium: 140 mmol/L (ref 135–145)
TCO2: 20 mmol/L — ABNORMAL LOW (ref 22–32)

## 2020-06-02 LAB — CBC
HCT: 46.8 % (ref 39.0–52.0)
Hemoglobin: 15.9 g/dL (ref 13.0–17.0)
MCH: 33 pg (ref 26.0–34.0)
MCHC: 34 g/dL (ref 30.0–36.0)
MCV: 97.1 fL (ref 80.0–100.0)
Platelets: 250 10*3/uL (ref 150–400)
RBC: 4.82 MIL/uL (ref 4.22–5.81)
RDW: 12 % (ref 11.5–15.5)
WBC: 14.8 10*3/uL — ABNORMAL HIGH (ref 4.0–10.5)
nRBC: 0 % (ref 0.0–0.2)

## 2020-06-02 LAB — COMPREHENSIVE METABOLIC PANEL
ALT: 18 U/L (ref 0–44)
AST: 29 U/L (ref 15–41)
Albumin: 4 g/dL (ref 3.5–5.0)
Alkaline Phosphatase: 53 U/L (ref 38–126)
Anion gap: 15 (ref 5–15)
BUN: 10 mg/dL (ref 6–20)
CO2: 19 mmol/L — ABNORMAL LOW (ref 22–32)
Calcium: 8.2 mg/dL — ABNORMAL LOW (ref 8.9–10.3)
Chloride: 104 mmol/L (ref 98–111)
Creatinine, Ser: 1.18 mg/dL (ref 0.61–1.24)
GFR calc Af Amer: >60 mL/min (ref >60)
GFR calc non Af Amer: >60 mL/min (ref >60)
Glucose, Bld: 107 mg/dL — ABNORMAL HIGH (ref 70–99)
Potassium: 2.9 mmol/L — ABNORMAL LOW (ref 3.5–5.1)
Sodium: 138 mmol/L (ref 135–145)
Total Bilirubin: 0.6 mg/dL (ref 0.3–1.2)
Total Protein: 6.5 g/dL (ref 6.5–8.1)

## 2020-06-02 LAB — SAMPLE TO BLOOD BANK

## 2020-06-02 MED ORDER — CEFAZOLIN SODIUM-DEXTROSE 2-4 GM/100ML-% IV SOLN
2.0000 g | Freq: Once | INTRAVENOUS | Status: AC
  Administered 2020-06-02: 2 g via INTRAVENOUS

## 2020-06-02 MED ORDER — SODIUM CHLORIDE 0.9 % IV SOLN: Freq: Once | INTRAVENOUS | Status: AC

## 2020-06-02 MED ORDER — TETANUS-DIPHTH-ACELL PERTUSSIS 5-2.5-18.5 LF-MCG/0.5 IM SUSP
0.5000 mL | Freq: Once | INTRAMUSCULAR | Status: AC
  Administered 2020-06-02: 0.5 mL via INTRAMUSCULAR

## 2020-06-02 MED ORDER — IOHEXOL 300 MG/ML  SOLN
75.0000 mL | Freq: Once | INTRAMUSCULAR | Status: AC | PRN
  Administered 2020-06-02: 75 mL via INTRAVENOUS
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
## 2020-06-02 NOTE — ED Notes (Signed)
Patient to CT with RN
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
## 2020-06-03 ENCOUNTER — Inpatient Hospital Stay (HOSPITAL_COMMUNITY): Payer: Self-pay

## 2020-06-03 DIAGNOSIS — S0292XA Unspecified fracture of facial bones, initial encounter for closed fracture: Secondary | ICD-10-CM | POA: Diagnosis present

## 2020-06-03 DIAGNOSIS — S0181XA Laceration without foreign body of other part of head, initial encounter: Secondary | ICD-10-CM

## 2020-06-03 DIAGNOSIS — S01312A Laceration without foreign body of left ear, initial encounter: Secondary | ICD-10-CM

## 2020-06-03 LAB — CBC
HCT: 39.8 % (ref 39.0–52.0)
Hemoglobin: 13.7 g/dL (ref 13.0–17.0)
MCH: 33.2 pg (ref 26.0–34.0)
MCHC: 34.4 g/dL (ref 30.0–36.0)
MCV: 96.4 fL (ref 80.0–100.0)
Platelets: 155 10*3/uL (ref 150–400)
RBC: 4.13 MIL/uL — ABNORMAL LOW (ref 4.22–5.81)
RDW: 12.4 % (ref 11.5–15.5)
WBC: 16.5 10*3/uL — ABNORMAL HIGH (ref 4.0–10.5)
nRBC: 0 % (ref 0.0–0.2)

## 2020-06-03 LAB — I-STAT ARTERIAL BLOOD GAS, ED
Acid-base deficit: 7 mmol/L — ABNORMAL HIGH (ref 0.0–2.0)
Bicarbonate: 18.7 mmol/L — ABNORMAL LOW (ref 20.0–28.0)
Calcium, Ion: 1.12 mmol/L — ABNORMAL LOW (ref 1.15–1.40)
HCT: 40 % (ref 39.0–52.0)
Hemoglobin: 13.6 g/dL (ref 13.0–17.0)
O2 Saturation: 100 %
Potassium: 3 mmol/L — ABNORMAL LOW (ref 3.5–5.1)
Sodium: 141 mmol/L (ref 135–145)
TCO2: 20 mmol/L — ABNORMAL LOW (ref 22–32)
pCO2 arterial: 36.9 mmHg (ref 32.0–48.0)
pH, Arterial: 7.313 — ABNORMAL LOW (ref 7.350–7.450)
pO2, Arterial: 218 mmHg — ABNORMAL HIGH (ref 83.0–108.0)

## 2020-06-03 LAB — TRIGLYCERIDES: Triglycerides: 203 mg/dL — ABNORMAL HIGH (ref <150)

## 2020-06-03 LAB — BASIC METABOLIC PANEL WITH GFR
Anion gap: 16 — ABNORMAL HIGH (ref 5–15)
BUN: 8 mg/dL (ref 6–20)
CO2: 19 mmol/L — ABNORMAL LOW (ref 22–32)
Calcium: 7.6 mg/dL — ABNORMAL LOW (ref 8.9–10.3)
Chloride: 109 mmol/L (ref 98–111)
Creatinine, Ser: 0.9 mg/dL (ref 0.61–1.24)
GFR calc Af Amer: >60 mL/min
GFR calc non Af Amer: >60 mL/min
Glucose, Bld: 88 mg/dL (ref 70–99)
Potassium: 4.5 mmol/L (ref 3.5–5.1)
Sodium: 144 mmol/L (ref 135–145)

## 2020-06-03 LAB — URINALYSIS, ROUTINE W REFLEX MICROSCOPIC
Bilirubin Urine: NEGATIVE
Glucose, UA: 50 mg/dL — AB
Hgb urine dipstick: NEGATIVE
Ketones, ur: NEGATIVE mg/dL
Leukocytes,Ua: NEGATIVE
Nitrite: NEGATIVE
Protein, ur: NEGATIVE mg/dL
Specific Gravity, Urine: 1.031 — ABNORMAL HIGH (ref 1.005–1.030)
pH: 5 (ref 5.0–8.0)

## 2020-06-03 LAB — HIV ANTIBODY (ROUTINE TESTING W REFLEX): HIV Screen 4th Generation wRfx: NONREACTIVE

## 2020-06-03 LAB — SARS CORONAVIRUS 2 BY RT PCR (HOSPITAL ORDER, PERFORMED IN ~~LOC~~ HOSPITAL LAB): SARS Coronavirus 2: NEGATIVE

## 2020-06-03 LAB — LACTIC ACID, PLASMA: Lactic Acid, Venous: 3.3 mmol/L (ref 0.5–1.9)

## 2020-06-03 MED ORDER — ONDANSETRON HCL 4 MG/2ML IJ SOLN
4.0000 mg | Freq: Four times a day (QID) | INTRAMUSCULAR | Status: DC | PRN
  Administered 2020-06-03 – 2020-06-04 (×3): 4 mg via INTRAVENOUS
  Filled 2020-06-03 (×3): qty 2

## 2020-06-03 MED ORDER — POTASSIUM CHLORIDE 10 MEQ/100ML IV SOLN
10.0000 meq | INTRAVENOUS | Status: DC
  Administered 2020-06-03 (×3): 10 meq via INTRAVENOUS
  Filled 2020-06-03 (×6): qty 100

## 2020-06-03 MED ORDER — FENTANYL CITRATE (PF) 100 MCG/2ML IJ SOLN
50.0000 ug | Freq: Once | INTRAMUSCULAR | Status: AC
  Administered 2020-06-03: 50 ug via INTRAVENOUS

## 2020-06-03 MED ORDER — CLINDAMYCIN PHOSPHATE 600 MG/50ML IV SOLN
600.0000 mg | Freq: Three times a day (TID) | INTRAVENOUS | Status: DC
  Administered 2020-06-03 – 2020-06-05 (×7): 600 mg via INTRAVENOUS
  Filled 2020-06-03 (×11): qty 50

## 2020-06-03 MED ORDER — DEXMEDETOMIDINE HCL IN NACL 400 MCG/100ML IV SOLN
0.2000 ug/kg/h | INTRAVENOUS | Status: DC
  Administered 2020-06-03: 0.2 ug/kg/h via INTRAVENOUS
  Administered 2020-06-03: 0.6 ug/kg/h via INTRAVENOUS
  Filled 2020-06-03 (×2): qty 100

## 2020-06-03 MED ORDER — METOPROLOL TARTRATE 5 MG/5ML IV SOLN: 5.0000 mg | Freq: Four times a day (QID) | INTRAVENOUS | Status: DC | PRN

## 2020-06-03 MED ORDER — ONDANSETRON 4 MG PO TBDP
4.0000 mg | ORAL_TABLET | Freq: Four times a day (QID) | ORAL | Status: DC | PRN
  Administered 2020-06-04: 4 mg via ORAL
  Filled 2020-06-03: qty 1

## 2020-06-03 MED ORDER — MIDAZOLAM HCL 2 MG/2ML IJ SOLN
INTRAMUSCULAR | Status: AC
  Filled 2020-06-03: qty 4

## 2020-06-03 MED ORDER — FENTANYL BOLUS VIA INFUSION
50.0000 ug | INTRAVENOUS | Status: DC | PRN
  Administered 2020-06-03: 50 ug via INTRAVENOUS
  Filled 2020-06-03: qty 50

## 2020-06-03 MED ORDER — ETOMIDATE 2 MG/ML IV SOLN
INTRAVENOUS | Status: AC | PRN
  Administered 2020-06-03: 20 mg via INTRAVENOUS

## 2020-06-03 MED ORDER — THIAMINE HCL 100 MG/ML IJ SOLN
100.0000 mg | Freq: Every day | INTRAMUSCULAR | Status: DC
  Administered 2020-06-03: 100 mg via INTRAVENOUS
  Filled 2020-06-03: qty 2

## 2020-06-03 MED ORDER — ORAL CARE MOUTH RINSE
15.0000 mL | OROMUCOSAL | Status: DC
  Administered 2020-06-03 (×6): 15 mL via OROMUCOSAL

## 2020-06-03 MED ORDER — ENOXAPARIN SODIUM 30 MG/0.3ML ~~LOC~~ SOLN
30.0000 mg | Freq: Two times a day (BID) | SUBCUTANEOUS | Status: DC
  Administered 2020-06-03 – 2020-06-05 (×5): 30 mg via SUBCUTANEOUS
  Filled 2020-06-03 (×5): qty 0.3

## 2020-06-03 MED ORDER — POTASSIUM CHLORIDE IN NACL 20-0.9 MEQ/L-% IV SOLN
INTRAVENOUS | Status: DC
  Filled 2020-06-03 (×6): qty 1000

## 2020-06-03 MED ORDER — FOLIC ACID 5 MG/ML IJ SOLN
1.0000 mg | Freq: Every day | INTRAMUSCULAR | Status: DC
  Administered 2020-06-03: 1 mg via INTRAVENOUS
  Filled 2020-06-03 (×2): qty 0.2

## 2020-06-03 MED ORDER — PROPOFOL 1000 MG/100ML IV EMUL
INTRAVENOUS | Status: AC | PRN
  Administered 2020-06-03: 25 ug/kg/min via INTRAVENOUS

## 2020-06-03 MED ORDER — PROPOFOL 1000 MG/100ML IV EMUL
0.0000 ug/kg/min | INTRAVENOUS | Status: DC
  Administered 2020-06-03: 35 ug/kg/min via INTRAVENOUS
  Administered 2020-06-03: 40 ug/kg/min via INTRAVENOUS
  Filled 2020-06-03 (×2): qty 100

## 2020-06-03 MED ORDER — PROPOFOL 1000 MG/100ML IV EMUL
INTRAVENOUS | Status: AC
  Filled 2020-06-03: qty 100

## 2020-06-03 MED ORDER — ROCURONIUM BROMIDE 50 MG/5ML IV SOLN
INTRAVENOUS | Status: AC | PRN
  Administered 2020-06-03: 50 mg via INTRAVENOUS

## 2020-06-03 MED ORDER — BACITRACIN ZINC 500 UNIT/GM EX OINT
TOPICAL_OINTMENT | Freq: Two times a day (BID) | CUTANEOUS | Status: DC
  Administered 2020-06-03 – 2020-06-04 (×2): 1 via TOPICAL
  Filled 2020-06-03 (×2): qty 28.4

## 2020-06-03 MED ORDER — CHLORHEXIDINE GLUCONATE 0.12% ORAL RINSE (MEDLINE KIT)
15.0000 mL | Freq: Two times a day (BID) | OROMUCOSAL | Status: DC
  Administered 2020-06-03: 15 mL via OROMUCOSAL

## 2020-06-03 MED ORDER — POLYETHYLENE GLYCOL 3350 17 G PO PACK: 17.0000 g | PACK | Freq: Every day | ORAL | Status: DC

## 2020-06-03 MED ORDER — PANTOPRAZOLE SODIUM 40 MG PO TBEC
40.0000 mg | DELAYED_RELEASE_TABLET | Freq: Every day | ORAL | Status: DC
  Administered 2020-06-05: 40 mg via ORAL
  Filled 2020-06-03: qty 1

## 2020-06-03 MED ORDER — FENTANYL 2500MCG IN NS 250ML (10MCG/ML) PREMIX INFUSION
50.0000 ug/h | INTRAVENOUS | Status: DC
  Administered 2020-06-03: 50 ug/h via INTRAVENOUS
  Administered 2020-06-03: 100 ug/h via INTRAVENOUS
  Filled 2020-06-03 (×3): qty 250

## 2020-06-03 MED ORDER — BACITRACIN ZINC 500 UNIT/GM EX OINT
TOPICAL_OINTMENT | Freq: Every day | CUTANEOUS | Status: DC
  Administered 2020-06-03 – 2020-06-04 (×2): 1 via TOPICAL
  Filled 2020-06-03: qty 28.4

## 2020-06-03 MED ORDER — ORAL CARE MOUTH RINSE
15.0000 mL | Freq: Two times a day (BID) | OROMUCOSAL | Status: DC
  Administered 2020-06-03 – 2020-06-05 (×4): 15 mL via OROMUCOSAL

## 2020-06-03 MED ORDER — DOCUSATE SODIUM 50 MG/5ML PO LIQD
100.0000 mg | Freq: Two times a day (BID) | ORAL | Status: DC
  Filled 2020-06-03: qty 10

## 2020-06-03 MED ORDER — PANTOPRAZOLE SODIUM 40 MG IV SOLR
40.0000 mg | Freq: Every day | INTRAVENOUS | Status: DC
  Administered 2020-06-03 – 2020-06-04 (×2): 40 mg via INTRAVENOUS
  Filled 2020-06-03 (×2): qty 40

## 2020-06-03 MED ORDER — DOCUSATE SODIUM 50 MG/5ML PO LIQD: 100.0000 mg | Freq: Two times a day (BID) | ORAL | Status: DC

## 2020-06-03 MED ORDER — CHLORHEXIDINE GLUCONATE CLOTH 2 % EX PADS
6.0000 | MEDICATED_PAD | Freq: Every day | CUTANEOUS | Status: DC
  Administered 2020-06-03 – 2020-06-04 (×2): 6 via TOPICAL

## 2020-06-03 MED ORDER — FENTANYL CITRATE (PF) 100 MCG/2ML IJ SOLN
INTRAMUSCULAR | Status: AC
  Filled 2020-06-03: qty 2

## 2020-06-03 NOTE — ED Notes (Signed)
Called Dr Elijah Birk to Dr Brayton Layman

## 2020-06-03 NOTE — Progress Notes (Signed)
Subjective: Patient remains intubated and sedated today in the unit.  There are no issues or concerns regarding his facial lacerations.  Objective: Vital signs in last 24 hours: Temp:  [96.2 F (35.7 C)-99.9 F (37.7 C)] 99.9 F (37.7 C) (06/05 1110) Pulse Rate:  [58-130] 74 (06/05 1110) Resp:  [0-24] 16 (06/05 1110) BP: (90-168)/(51-100) 94/51 (06/05 1110) SpO2:  [91 %-100 %] 100 % (06/05 1111) FiO2 (%):  [40 %-100 %] 40 % (06/05 1111) Weight:  [69.6 kg-72.6 kg] 69.6 kg (06/05 1000) Wt Readings from Last 1 Encounters:  06/03/20 69.6 kg    Intake/Output from previous day: 06/04 0701 - 06/05 0700 In: 1145 [I.V.:1145] Out: 950 [Urine:950] Intake/Output this shift: Total I/O In: 267.6 [I.V.:197.8; IV Piggyback:69.8] Out: -   Patient is sedated and intubated. Facial lacerations are nicely closed with no exposed cartilage on the nasal bone or left auricle The auricle appears healthy and there is no evidence of early infection.  Recent Labs    06/02/20 2310 06/02/20 2310 06/02/20 2354 06/03/20 0221  WBC 14.8*  --   --   --   HGB 15.9   < > 15.0 13.6  HCT 46.8   < > 44.0 40.0  PLT 250  --   --   --    < > = values in this interval not displayed.    Recent Labs    06/02/20 2310 06/02/20 2310 06/02/20 2354 06/03/20 0221  NA 138   < > 140 141  K 2.9*   < > 2.9* 3.0*  CL 104  --  107  --   CO2 19*  --   --   --   GLUCOSE 107*  --  108*  --   BUN 10  --  10  --   CREATININE 1.18  --  1.50*  --   CALCIUM 8.2*  --   --   --    < > = values in this interval not displayed.    Medications: I have reviewed the patient's current medications.  Assessment/Plan: Complex facial lacerations - multiple  This patient is at high risk for an infection involving the left auricle due to the severity of his injury and the denuded nature of the skin resulting in exposed cartilage.  Currently, things look good but it is a bit early for an infection or significant necrosis.  1.   Continue clindamycin for staph coverage and cartilage penetration.  May also consider a fluoroquinolone if needed.    2.  The wounds look good.  We will continue to apply bacitracin ointment liberally to the entire auricle and other closed lacerations.   LOS: 0 days   AMARU BURROUGHS 06/03/2020, 11:41 AM

## 2020-06-03 NOTE — Progress Notes (Signed)
Patient transported to 4N15 without complications.
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
## 2020-06-03 NOTE — Plan of Care (Signed)
  Problem: Activity: Goal: Ability to tolerate increased activity will improve Outcome: Completed/Met   Problem: Respiratory: Goal: Ability to maintain a clear airway and adequate ventilation will improve Outcome: Completed/Met   Problem: Role Relationship: Goal: Method of communication will improve Outcome: Completed/Met

## 2020-06-03 NOTE — Progress Notes (Signed)
Patient insisting on using bathroom, informed patient of recent sedated procedure in OR and active order for bedrest. Discussed options such as bed pan and urinal with patient, patient verbally aggressive and physically pushing past staff to reach bathroom. Out of concern for patient safety staff assisted patient onto toilet in bathroom, spO2 on room air currently 95, patient states they are not feeling dizzy at this time. RN will remain in room to monitor patient closely.  Aris Lot, RN

## 2020-06-03 NOTE — Progress Notes (Signed)
2 small silver body jewelry barbells removed from patient's nipples, placed in specimen cup at bedside.  Aris Lot, RN
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
## 2020-06-03 NOTE — Procedures (Signed)
Extubation Procedure Note  Patient Details:   Name: Shondell Poulson DOB: Jun 02, 1986 MRN: [id number]   Airway Documentation:   Patient extubated per MD orders. Placed on 4lpm nasal cannula. Patient had positive cuff leak prior to extubation. Will continue to monitor. Vent end date: 06/03/20 Vent end time: 1728   Evaluation  O2 sats: stable throughout Complications: No apparent complications Patient did tolerate procedure well. Bilateral Breath Sounds: Clear   Yes  Suszanne Conners 06/03/2020, 5:29 PM

## 2020-06-03 NOTE — Op Note (Signed)
Procedure Note  Preop diagnosis: Complex left nasal laceration with exposed cartilage (4 cm)                             Complex left upper lip laceration with disruption of the underlying muscle (7 cm)                             Complex, denuded laceration involving the posterior left auricle with exposed                                               cartilage (10 cm total)                              Postop diagnosis: Same  Indications: This patient is already intubated and essentially under general anesthesia here in the trauma bay.  We elected to clean and closed the wound here.     Surgeon: Rejeana Brock   Assistants: None  Anesthesia: General endotracheal anesthesia  ASA Class: none    Procedure Detail  Patient's face was prepped and draped in sterile fashion after being washed thoroughly with Betadine solution.  The wounds were then forcibly irrigated with copious amounts of sterile saline.  Examination of both the nasal and auricular lacerations revealed severely macerated skin with exposed underlying cartilage.  The nasal skin was closed using interrupted 4-0 Prolene sutures.  I was able to close the skin and completely cover the exposed cartilage.  The left lip laceration was then closed in layers.  The orbicularis oris muscle was partially transected.  Several deep 4-0 Vicryl sutures were used to reattach the severed muscle.  The skin was brought together using interrupted 4-0 Prolene sutures.    The left auricle was severely injured with exposed cartilage on the posterior aspect of the pinna.  I elected to reprep and drape this area because of the increased risk of infection to the cartilage.  This was done after scrubbing the ear with Betadine and irrigating a second time with copious amounts of sterile saline.  The skin edges were approximated using interrupted 4-0 Prolene sutures.  I was able to cover all of the exposed cartilage of the auricle.  Betadine was  cleaned free from the skin.  Bacitracin ointment was placed over all 3 closed incision sites.  Estimated Blood Loss:  less than 50 mL         Drains: none              Complications:  * No surgery found *         Disposition: Patient remains in the trauma bay         Condition: stable

## 2020-06-03 NOTE — ED Provider Notes (Signed)
..  Laceration Repair  Date/Time: 06/03/2020 1:55 AM Performed by: Carroll Sage, PA-C Authorized by: Carroll Sage, PA-C   Consent:    Consent obtained:  Emergent situation   Alternatives discussed:  No treatment Anesthesia (see MAR for exact dosages):    Anesthesia method:  Local infiltration   Local anesthetic:  Lidocaine 1% w/o epi Laceration details:    Location:  Face   Face location:  Forehead   Length (cm):  3   Depth (mm):  1 Repair type:    Repair type:  Simple Pre-procedure details:    Preparation:  Patient was prepped and draped in usual sterile fashion Exploration:    Wound exploration: wound explored through full range of motion and entire depth of wound probed and visualized     Contaminated: no   Treatment:    Area cleansed with:  Saline   Amount of cleaning:  Standard   Irrigation solution:  Sterile water   Irrigation method:  Syringe   Visualized foreign bodies/material removed: no   Skin repair:    Repair method:  Sutures   Suture size:  4-0   Suture material:  Prolene   Suture technique:  Simple interrupted   Number of sutures:  3 Approximation:    Approximation:  Loose Post-procedure details:    Dressing:  Open (no dressing)   Patient tolerance of procedure:  Tolerated well, no immediate complications .Marland KitchenLaceration Repair  Date/Time: 06/03/2020 1:56 AM Performed by: Carroll Sage, PA-C Authorized by: Carroll Sage, PA-C   Consent:    Consent obtained:  Emergent situation   Alternatives discussed:  No treatment Anesthesia (see MAR for exact dosages):    Anesthesia method:  Local infiltration   Local anesthetic:  Lidocaine 1% w/o epi Laceration details:    Location:  Face   Face location:  Forehead   Length (cm):  2   Depth (mm):  1 Repair type:    Repair type:  Simple Pre-procedure details:    Preparation:  Patient was prepped and draped in usual sterile fashion Exploration:    Contaminated: no   Treatment:    Area  cleansed with:  Saline   Amount of cleaning:  Standard   Irrigation solution:  Sterile water   Irrigation method:  Syringe   Visualized foreign bodies/material removed: no   Skin repair:    Repair method:  Sutures   Suture size:  4-0   Suture material:  Prolene   Suture technique:  Simple interrupted   Number of sutures:  2 Approximation:    Approximation:  Close Post-procedure details:    Dressing:  Open (no dressing)   Patient tolerance of procedure:  Tolerated well, no immediate complications      Sonny, [place], PA-C 06/03/20 0200    Pollyann Savoy, MD 06/03/20 [phone]

## 2020-06-03 NOTE — Progress Notes (Signed)
Patient awake despite full sedation and very appropriate, pantomimed writing and so provided writing supplies. Patient requested personal phone and called his girlfriend, Oletta Cohn, I asked him if I could share information with her and he nodded yes. Updated girlfriend and answered all questions, she informed me that patient has a sister named Chastity who can be reached at [phone]. Provided girlfriend with patient room number and advised on visitation policy.   Aris Lot, RN

## 2020-06-04 LAB — TRIGLYCERIDES: Triglycerides: 111 mg/dL (ref <150)

## 2020-06-04 LAB — BASIC METABOLIC PANEL
Anion gap: 7 (ref 5–15)
BUN: 9 mg/dL (ref 6–20)
CO2: 23 mmol/L (ref 22–32)
Calcium: 7.5 mg/dL — ABNORMAL LOW (ref 8.9–10.3)
Chloride: 106 mmol/L (ref 98–111)
Creatinine, Ser: 0.98 mg/dL (ref 0.61–1.24)
GFR calc Af Amer: >60 mL/min (ref >60)
GFR calc non Af Amer: >60 mL/min (ref >60)
Glucose, Bld: 148 mg/dL — ABNORMAL HIGH (ref 70–99)
Potassium: 4.2 mmol/L (ref 3.5–5.1)
Sodium: 136 mmol/L (ref 135–145)

## 2020-06-04 LAB — CBC
HCT: 33.4 % — ABNORMAL LOW (ref 39.0–52.0)
HCT: 31.4 % — ABNORMAL LOW (ref 39.0–52.0)
Hemoglobin: 11.3 g/dL — ABNORMAL LOW (ref 13.0–17.0)
Hemoglobin: 10.7 g/dL — ABNORMAL LOW (ref 13.0–17.0)
MCH: 32.7 pg (ref 26.0–34.0)
MCH: 32.6 pg (ref 26.0–34.0)
MCHC: 33.8 g/dL (ref 30.0–36.0)
MCHC: 34.1 g/dL (ref 30.0–36.0)
MCV: 96.5 fL (ref 80.0–100.0)
MCV: 95.7 fL (ref 80.0–100.0)
Platelets: 134 10*3/uL — ABNORMAL LOW (ref 150–400)
Platelets: 132 10*3/uL — ABNORMAL LOW (ref 150–400)
RBC: 3.46 MIL/uL — ABNORMAL LOW (ref 4.22–5.81)
RBC: 3.28 MIL/uL — ABNORMAL LOW (ref 4.22–5.81)
RDW: 12.3 % (ref 11.5–15.5)
RDW: 12.2 % (ref 11.5–15.5)
WBC: 10.9 10*3/uL — ABNORMAL HIGH (ref 4.0–10.5)
WBC: 10.6 10*3/uL — ABNORMAL HIGH (ref 4.0–10.5)
nRBC: 0 % (ref 0.0–0.2)
nRBC: 0 % (ref 0.0–0.2)

## 2020-06-04 LAB — COMPREHENSIVE METABOLIC PANEL
ALT: 18 U/L (ref 0–44)
AST: 33 U/L (ref 15–41)
Albumin: 2.8 g/dL — ABNORMAL LOW (ref 3.5–5.0)
Alkaline Phosphatase: 42 U/L (ref 38–126)
Anion gap: 8 (ref 5–15)
BUN: 7 mg/dL (ref 6–20)
CO2: 22 mmol/L (ref 22–32)
Calcium: 7.7 mg/dL — ABNORMAL LOW (ref 8.9–10.3)
Chloride: 105 mmol/L (ref 98–111)
Creatinine, Ser: 0.87 mg/dL (ref 0.61–1.24)
GFR calc Af Amer: >60 mL/min (ref >60)
GFR calc non Af Amer: >60 mL/min (ref >60)
Glucose, Bld: 120 mg/dL — ABNORMAL HIGH (ref 70–99)
Potassium: 4 mmol/L (ref 3.5–5.1)
Sodium: 135 mmol/L (ref 135–145)
Total Bilirubin: 0.8 mg/dL (ref 0.3–1.2)
Total Protein: 5.1 g/dL — ABNORMAL LOW (ref 6.5–8.1)

## 2020-06-04 LAB — PHOSPHORUS: Phosphorus: 1.5 mg/dL — ABNORMAL LOW (ref 2.5–4.6)

## 2020-06-04 LAB — MAGNESIUM: Magnesium: 1.6 mg/dL — ABNORMAL LOW (ref 1.7–2.4)

## 2020-06-04 MED ORDER — THIAMINE HCL 100 MG/ML IJ SOLN
100.0000 mg | Freq: Every day | INTRAMUSCULAR | Status: DC
  Administered 2020-06-04: 100 mg via INTRAVENOUS
  Filled 2020-06-04: qty 2

## 2020-06-04 MED ORDER — POTASSIUM PHOSPHATES 15 MMOLE/5ML IV SOLN
15.0000 mmol | Freq: Once | INTRAVENOUS | Status: AC
  Administered 2020-06-04: 15 mmol via INTRAVENOUS
  Filled 2020-06-04 (×2): qty 5

## 2020-06-04 MED ORDER — LORAZEPAM 2 MG/ML IJ SOLN
0.0000 mg | Freq: Four times a day (QID) | INTRAMUSCULAR | Status: DC
  Administered 2020-06-04: 1 mg via INTRAVENOUS
  Filled 2020-06-04: qty 1

## 2020-06-04 MED ORDER — THIAMINE HCL 100 MG PO TABS
100.0000 mg | ORAL_TABLET | Freq: Every day | ORAL | Status: DC
  Administered 2020-06-05: 100 mg via ORAL
  Filled 2020-06-04: qty 1

## 2020-06-04 MED ORDER — LORAZEPAM 1 MG PO TABS
1.0000 mg | ORAL_TABLET | ORAL | Status: DC | PRN
  Administered 2020-06-05: 1 mg via ORAL
  Filled 2020-06-04: qty 1

## 2020-06-04 MED ORDER — OXYCODONE HCL 5 MG PO TABS
10.0000 mg | ORAL_TABLET | ORAL | Status: DC | PRN
  Administered 2020-06-04 – 2020-06-05 (×7): 10 mg via ORAL
  Filled 2020-06-04 (×7): qty 2

## 2020-06-04 MED ORDER — DOCUSATE SODIUM 50 MG/5ML PO LIQD
100.0000 mg | Freq: Two times a day (BID) | ORAL | Status: DC
  Filled 2020-06-04: qty 10

## 2020-06-04 MED ORDER — METHOCARBAMOL 1000 MG/10ML IJ SOLN: 1000.0000 mg | Freq: Three times a day (TID) | INTRAVENOUS | Status: DC | PRN

## 2020-06-04 MED ORDER — MORPHINE SULFATE (PF) 2 MG/ML IV SOLN: 1.0000 mg | INTRAVENOUS | Status: DC | PRN

## 2020-06-04 MED ORDER — LORAZEPAM 2 MG/ML IJ SOLN: 1.0000 mg | INTRAMUSCULAR | Status: DC | PRN

## 2020-06-04 MED ORDER — FOLIC ACID 1 MG PO TABS
1.0000 mg | ORAL_TABLET | Freq: Every day | ORAL | Status: DC
  Administered 2020-06-04 – 2020-06-05 (×2): 1 mg via ORAL
  Filled 2020-06-04 (×2): qty 1

## 2020-06-04 MED ORDER — POLYETHYLENE GLYCOL 3350 17 G PO PACK: 17.0000 g | PACK | Freq: Every day | ORAL | Status: DC

## 2020-06-04 MED ORDER — MORPHINE SULFATE (PF) 2 MG/ML IV SOLN
2.0000 mg | INTRAVENOUS | Status: DC | PRN
  Administered 2020-06-04 (×3): 2 mg via INTRAVENOUS
  Filled 2020-06-04 (×3): qty 1

## 2020-06-04 MED ORDER — OXYCODONE HCL 5 MG PO TABS
5.0000 mg | ORAL_TABLET | ORAL | Status: DC | PRN
  Administered 2020-06-04: 5 mg via ORAL
  Filled 2020-06-04: qty 1

## 2020-06-04 MED ORDER — ADULT MULTIVITAMIN W/MINERALS CH
1.0000 | ORAL_TABLET | Freq: Every day | ORAL | Status: DC
  Administered 2020-06-04 – 2020-06-05 (×2): 1 via ORAL
  Filled 2020-06-04 (×2): qty 1

## 2020-06-04 MED ORDER — MAGNESIUM SULFATE 2 GM/50ML IV SOLN
2.0000 g | Freq: Once | INTRAVENOUS | Status: AC
  Administered 2020-06-04: 2 g via INTRAVENOUS
  Filled 2020-06-04: qty 50

## 2020-06-04 MED ORDER — LORAZEPAM 2 MG/ML IJ SOLN: 0.0000 mg | Freq: Two times a day (BID) | INTRAMUSCULAR | Status: DC
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
## 2020-06-05 LAB — CBC
HCT: 28.9 % — ABNORMAL LOW (ref 39.0–52.0)
Hemoglobin: 10 g/dL — ABNORMAL LOW (ref 13.0–17.0)
MCH: 32.7 pg (ref 26.0–34.0)
MCHC: 34.6 g/dL (ref 30.0–36.0)
MCV: 94.4 fL (ref 80.0–100.0)
Platelets: 126 10*3/uL — ABNORMAL LOW (ref 150–400)
RBC: 3.06 MIL/uL — ABNORMAL LOW (ref 4.22–5.81)
RDW: 12.1 % (ref 11.5–15.5)
WBC: 6 10*3/uL (ref 4.0–10.5)
nRBC: 0 % (ref 0.0–0.2)

## 2020-06-05 LAB — BASIC METABOLIC PANEL
Anion gap: 7 (ref 5–15)
BUN: <5 mg/dL — ABNORMAL LOW (ref 6–20)
CO2: 24 mmol/L (ref 22–32)
Calcium: 7.7 mg/dL — ABNORMAL LOW (ref 8.9–10.3)
Chloride: 104 mmol/L (ref 98–111)
Creatinine, Ser: 0.77 mg/dL (ref 0.61–1.24)
GFR calc Af Amer: >60 mL/min (ref >60)
GFR calc non Af Amer: >60 mL/min (ref >60)
Glucose, Bld: 101 mg/dL — ABNORMAL HIGH (ref 70–99)
Potassium: 3.8 mmol/L (ref 3.5–5.1)
Sodium: 135 mmol/L (ref 135–145)

## 2020-06-05 LAB — TRIGLYCERIDES: Triglycerides: 111 mg/dL (ref <150)

## 2020-06-05 MED ORDER — CLINDAMYCIN HCL 300 MG PO CAPS
300.0000 mg | ORAL_CAPSULE | Freq: Three times a day (TID) | ORAL | 0 refills | Status: AC
Start: 2020-06-05 — End: 2020-06-11

## 2020-06-05 MED ORDER — OXYCODONE HCL 10 MG PO TABS: 5.0000 mg | ORAL_TABLET | Freq: Four times a day (QID) | ORAL | 0 refills | Status: AC | PRN

## 2020-06-05 MED ORDER — BACITRACIN ZINC 500 UNIT/GM EX OINT: TOPICAL_OINTMENT | Freq: Every day | CUTANEOUS | 0 refills | Status: AC

## 2020-06-05 MED ORDER — METHOCARBAMOL 500 MG PO TABS: 1000.0000 mg | ORAL_TABLET | Freq: Three times a day (TID) | ORAL | 0 refills | Status: AC | PRN

## 2020-06-05 MED ORDER — METHOCARBAMOL 500 MG PO TABS
1000.0000 mg | ORAL_TABLET | Freq: Three times a day (TID) | ORAL | Status: DC
  Administered 2020-06-05: 1000 mg via ORAL
  Filled 2020-06-05: qty 2

## 2020-06-05 NOTE — Discharge Instructions (Signed)
Sutured Wound Care Sutures are stitches that can be used to close wounds. Taking care of your wound properly can help prevent pain and infection. It can also help your wound to heal more quickly. Follow instructions from your doctor about how to care for your sutured wound. Supplies needed:  Soap and water.  A clean bandage (dressing), if needed.  Antibiotic ointment.  A clean towel. How to care for your sutured wound   Keep the wound completely dry for the first 24 hours or as long as told by your doctor. After 24-48 hours, you may shower or bathe as told by your doctor. Do not soak the wound or put the wound completely under water until the sutures have been removed.  After the first 24 hours, clean the wound once a day, or as often as your doctor tells you to. Take these steps: ? Wash the wound with soap and water. ? Rinse the wound with water. Make sure to wash all the soap off. ? Pat the wound dry with a clean towel. Do not rub the wound.  After cleaning the wound, put a thin layer of antibiotic ointment on the wound as told by your doctor. This will help: ? Prevent infection. ? Keep the bandage from sticking to the wound.  Follow instructions from your doctor about how to change your bandage: ? Wash your hands with soap and water. If you cannot use soap and water, use hand sanitizer. ? Change your bandage at least once a day, or as often as told by your doctor. If your dressing gets wet or dirty, change it. ? Leave sutures, skin glue, or skin tape (adhesive) strips in place. They may need to stay in place for 2 weeks or longer. If tape strips get loose and curl up, you may trim the loose edges. Do not remove tape strips completely unless your doctor says it is okay.  Check your wound every day for signs of infection. Watch for: ? Redness, swelling, or pain. ? Fluid or blood. ? Warmth. ? Pus or a bad smell.  Have the sutures removed as told by your doctor. Follow these  instructions at home: Medicines  Take or apply over-the-counter and prescription medicines only as told by your doctor.  If you were prescribed an antibiotic medicine or ointment, take or apply it as told by your doctor. Do not stop using the antibiotic even if you start to feel better. General instructions  Cover your wound with clothes or put sunscreen on when you are outside. Use a sunscreen of at least 30 SPF.  Do not scratch or pick at your wound.  Avoid stretching your wound.  Raise (elevate) the injured area above the level of your heart while you are sitting or lying down, if possible.  Drink enough fluids to keep your pee (urine) clear or pale yellow.  Keep all follow-up visits as told by your doctor. This is important. Contact a doctor if:  You were given a tetanus shot and you have any of the following at the site where the needle went in: ? Swelling. ? Very bad pain. ? Redness. ? Bleeding.  Your wound breaks open.  You have redness, swelling, or pain around your wound.  You have fluid or blood coming from your wound.  Your wound feels warm to the touch.  You have a fever.  You notice something coming out of your wound, such as wood or glass.  You have pain that does not   get better with medicine.  The skin near your wound changes color.  You need to change your bandage often due to a lot of fluid, blood, or pus coming from the wound.  You get a new rash.  You get numbness around the wound. Get help right away if:  You have very bad swelling around your wound.  You have pus or a bad smell coming from your wound.  Your pain suddenly gets worse and is very bad.  You have painful lumps near your wound or anywhere on your body.  You have a red streak going away from your wound.  The wound is on your hand or foot, and: ? You cannot move a finger or toe as you used to do. ? Your fingers or toes look pale or blue. ? You have numbness that spreads down  your hand, foot, fingers, or toes. Summary  Sutures are stitches that are used to close wounds.  Taking care of your wound properly can help prevent pain and infection.  Keep the wound completely dry for the first 24 hours or for as long as told by your doctor. After 24-48 hours, you may shower or bathe as directed by your doctor. This information is not intended to replace advice given to you by your health care provider. Make sure you discuss any questions you have with your health care provider. Document Revised: 11/28/2017 Document Reviewed: 01/21/2017 Elsevier Patient Education  2020 [employer] Inc[employer]   Abrasion/road rash  Apply vaseline/ointment to these areas to keep moist and cover with dry bandage  An abrasion is a cut or a scrape on the outer surface of the skin. An abrasion does not go through all the layers of the skin. It is important to care for your abrasion properly to prevent infection. What are the causes? This condition is caused by falling on or gliding across the ground or another surface. When your skin rubs on something, the outer and inner layers of skin may rub off. What are the signs or symptoms? The main symptom of this condition is a cut or a scrape. The scrape may be bleeding, or it may appear red or pink. If the abrasion was caused by a fall, there may be a bruise under the cut or scrape. How is this diagnosed? An abrasion is diagnosed with a physical exam. How is this treated? Treatment for this condition depends on how large and deep the abrasion is. In most cases:  Your abrasion will be cleaned with water and mild soap. This is done to remove any dirt or debris (such as particles of glass or rock) that may be stuck in the wound.  An antibiotic ointment may be applied to the abrasion to help prevent infection.  A bandage (dressing) may be placed on the abrasion to keep it clean. You may also need a tetanus shot. Follow these instructions at  home: Medicines  Take or apply over-the-counter and prescription medicines only as told by your health care provider.  If you were prescribed an antibiotic medicine, apply it as told by your health care provider. Wound care  Clean the wound 2-3 times a day, or as directed by your health care provider. To do this, wash the wound with mild soap and water, rinse off the soap, and pat the wound dry with a clean towel. Do not rub the wound.  Keep the dressing clean and dry as told by your health care provider.  There are many different ways  to close and cover a wound. Follow instructions from your health care provider about: ? Caring for your wound. ? Changing and removing your dressing. You may have to change your dressing one or more times a day, or as directed by your health care provider.  Check your wound every day for signs of infection. Check for: ? Redness, particularly a red streak that spreads out from the wound. ? Swelling or increased pain. ? Warmth. ? Fluid, pus, or a bad smell.  If directed, put ice on the injured area to reduce pain and swelling: ? Put ice in a plastic bag. ? Place a towel between your skin and the bag. ? Leave the ice on for 20 minutes, 2-3 times a day. General instructions  Do not take baths, swim, or use a hot tub until your health care provider says it is okay to do so.  If possible, raise (elevate) the injured area above the level of your heart while you are sitting or lying down. This will reduce pain and swelling.  Keep all follow-up visits as directed by your health care provider. This is important. Contact a health care provider if:  You received a tetanus shot, and you have swelling, severe pain, redness, or bleeding at the injection site.  Your pain is not controlled with medicine.  You have redness, swelling, or more pain at the site of your wound. Get help right away if:  You have a red streak spreading away from your wound.  You  have a fever.  You have fluid, blood, or pus coming from your wound.  You notice a bad smell coming from your wound or your dressing. Summary  An abrasion is a cut or a scrape on the outer surface of the skin. An abrasion does not go through all the layers of the skin.  Care for your abrasion properly to prevent infection.  Clean the wound with mild soap and water 2-3 times a day. Follow instructions from your health care provider about taking medicines and changing your bandage (dressing).  Contact your health care provider if you have redness, swelling or more pain in the wound area.  Get help right away if you have a fever or if you have fluid, blood, pus, a bad smell, or a red streak coming from the wound. This information is not intended to replace advice given to you by your health care provider. Make sure you discuss any questions you have with your health care provider. Document Revised: 11/28/2017 Document Reviewed: 07/30/2017 Elsevier Patient Education  [place].

## 2020-06-05 NOTE — TOC CAGE-AID Note (Signed)
Transition of Care [facility]) - CAGE-AID Screening   Patient Details  Name: Ricky Colon MRN: [id number] Date of Birth: 1986-11-01  Transition of Care [facility]) CM/SW Contact:    Jimmy Picket, [place] Phone Number: 06/05/2020, 12:19 PM   Clinical Narrative:  Pt states he had a problem with alcohol but currently doesn't drink alcohol. CSW informed pt his blood alcohol content was.266. Pt stated that he did drink that day, he stated he had 2 mikes hard lemonades. Pt stated that was all the alcohol he has had in "a couple of months". Pt denied substance use. Pt was receptive to education. Pt declined Transport planner and resources.   CAGE-AID Screening:    Have You Ever Felt You Ought to Cut Down on Your Drinking or Drug Use?: Yes Have People Annoyed You By Critizing Your Drinking Or Drug Use?: No Have You Felt Bad Or Guilty About Your Drinking Or Drug Use?: No Have You Ever Had a Drink or Used Drugs First Thing In The Morning to STeady Your Nerves or to Get Rid of a Hangover?: No CAGE-AID Score: 1  Substance Abuse Education Offered: Yes    Denita Lung, Bridget Hartshorn Clinical Social Worker [phone]

## 2020-06-05 NOTE — Progress Notes (Signed)
Patient ID: Ricky Colon, male   DOB: 09/15/1986, 34 y.o.   MRN: [id number]       Subjective: Eating well.  C/o facial swelling but overall ok.  Off precedex due to ETOH withdrawal   ROS: See above, otherwise other systems negative  Objective: Vital signs in last 24 hours: Temp:  [97.6 F (36.4 C)-97.8 F (36.6 C)] 97.7 F (36.5 C) (06/07 0503) Pulse Rate:  [71-91] 81 (06/07 0503) Resp:  [13-21] 15 (06/06 2354) BP: (129-168)/(51-77) 129/63 (06/07 0503) SpO2:  [97 %-99 %] 99 % (06/07 0503) Last BM Date: 06/04/20  Intake/Output from previous day: 06/06 0701 - 06/07 0700 In: 1279.8 [I.V.:1015.4; IV Piggyback:264.4] Out: -  Intake/Output this shift: No intake/output data recorded.  PE: General: alert and no respiratory distress Neuro: alert, oriented and F/C HEENT/Neck: facial abrasions and lacs S/P repair by ENT, area behind L ear with bacitracin Resp: clear to auscultation bilaterally CVS: RRR GI: large abrasion L side, no gen tenderness Extremities: L elbow, R knee L foot abrasions dressed  Lab Results:  Recent Labs    06/04/20 0821 06/05/20 0340  WBC 10.6* 6.0  HGB 10.7* 10.0*  HCT 31.4* 28.9*  PLT 132* 126*   BMET Recent Labs    06/04/20 0821 06/05/20 0340  NA 135 135  K 4.0 3.8  CL 105 104  CO2 22 24  GLUCOSE 120* 101*  BUN 7 <5*  CREATININE 0.87 0.77  CALCIUM 7.7* 7.7*   PT/INR Recent Labs    06/02/20 2310  LABPROT 12.8  INR 1.0   CMP     Component Value Date/Time   NA 135 06/05/2020 0340   K 3.8 06/05/2020 0340   CL 104 06/05/2020 0340   CO2 24 06/05/2020 0340   GLUCOSE 101 (H) 06/05/2020 0340   BUN <5 (L) 06/05/2020 0340   CREATININE 0.77 06/05/2020 0340   CALCIUM 7.7 (L) 06/05/2020 0340   PROT 5.1 (L) 06/04/2020 0821   ALBUMIN 2.8 (L) 06/04/2020 0821   AST 33 06/04/2020 0821   ALT 18 06/04/2020 0821   ALKPHOS 42 06/04/2020 0821   BILITOT 0.8 06/04/2020 0821   GFRNONAA >60 06/05/2020 0340   GFRAA >60 06/05/2020 0340    Lipase  No results found for: LIPASE     Studies/Results: No results found.  Anti-infectives: Anti-infectives (From admission, onward)   Start     Dose/Rate Route Frequency Ordered Stop   06/03/20 0645  clindamycin (CLEOCIN) IVPB 600 mg     600 mg 100 mL/hr over 30 Minutes Intravenous Every 8 hours 06/03/20 0642 06/06/20 0559   06/02/20 2315  ceFAZolin (ANCEF) IVPB 2g/100 mL premix     2 g 200 mL/hr over 30 Minutes Intravenous  Once 06/02/20 2306 06/02/20 2355       Assessment/Plan [facility] Concussion - PT/OT Acute respiratory failure - tolerated extubation well, pulm toilet Multiple facial lacs, partial avulsion L pinna, nasal and maxillary spine FXs - S/P lac repairs by Dr. Elijah Birk and he rec continue Clinda for now ETOH abuse - CSW eval, off Precedex now on CIWA VTE - Lovenox FEN - soft Dispo - therapies and follow up on their recommendations   LOS: 2 days    Letha Cape , [facility] 06/05/2020, 8:04 AM Please see Amion for pager number during day hours 7:00am-4:30pm or 7:00am -11:30am on weekends
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
## 2020-06-06 ENCOUNTER — Encounter (HOSPITAL_COMMUNITY): Payer: Self-pay | Admitting: Orthopedic Surgery
